# Patient Record
Sex: Male | Born: 1955 | Race: Black or African American | Hispanic: No | Marital: Married | Smoking: Never smoker
Health system: Southern US, Community
[De-identification: ages and names within clinical notes are randomized; demographics above are authoritative.]

## PROBLEM LIST (undated history)

## (undated) DIAGNOSIS — T6701XA Heatstroke and sunstroke, initial encounter: Secondary | ICD-10-CM

## (undated) HISTORY — PX: SMALL INTESTINE SURGERY: SHX150

## (undated) HISTORY — DX: Heatstroke and sunstroke, initial encounter: T67.01XA

---

## 2012-05-19 ENCOUNTER — Ambulatory Visit (INDEPENDENT_AMBULATORY_CARE_PROVIDER_SITE_OTHER): Payer: Managed Care, Other (non HMO) | Admitting: Physician Assistant

## 2012-05-19 VITALS — BP 137/77 | HR 47 | Temp 97.8°F | Resp 16 | Ht 73.75 in | Wt 197.0 lb

## 2012-05-19 DIAGNOSIS — B356 Tinea cruris: Secondary | ICD-10-CM

## 2012-05-19 MED ORDER — CLOTRIMAZOLE 1 % EX CREA: TOPICAL_CREAM | Freq: Two times a day (BID) | CUTANEOUS | Status: DC

## 2012-05-19 NOTE — Patient Instructions (Addendum)
Begin using the clotrimazole cream as directed.  If your symptoms are not improving, or if they are worsening please come back in.   The permethrin that you received from your doctor should have taken care of scabies, if that is what was causing your itching.  You may continue to itch for a few days though.  Zyrtec in the morning and Benadryl at night will helping control the itching.    If symptoms persist, please let us know.   Jock Itch Jock itch is a fungal infection of the skin in the groin area. It is sometimes called "ringworm" even though it is not caused by a worm. A fungus is a type of germ that thrives in dark, damp places.  CAUSES  This infection may spread from:  A fungus infection elsewhere on the body (such as athlete's foot).  Sharing towels or clothing. This infection is more common in:  Hot, humid climates.  People who wear tight-fitting clothing or wet bathing suits for long periods of time.  Athletes.  Overweight people.  People with diabetes. SYMPTOMS  Jock itch causes the following symptoms:  Red, pink or brown rash in the groin. Rash may spread to the thighs, anus, and buttocks.  Itching. DIAGNOSIS  Your caregiver may make the diagnosis by looking at the rash. Sometimes a skin scraping will be sent to test for fungus. Testing can be done either by looking under the microscope or by doing a culture (test to try to grow the fungus). A culture can take up to 2 weeks to come back. TREATMENT  Jock itch may be treated with:  Skin cream or ointment to kill fungus.  Medicine by mouth to kill fungus.  Skin cream or ointment to calm the itching.  Compresses or medicated powders to dry the infected skin. HOME CARE INSTRUCTIONS   Be sure to treat the rash completely. Follow your caregiver's instructions. It can take a couple of weeks to treat. If you do not treat the infection long enough, the rash can come back.  Wear loose-fitting clothing.  Men should  wear cotton boxer shorts.  Women should wear cotton underwear.  Avoid hot baths.  Dry the groin area well after bathing. SEEK MEDICAL CARE IF:   Your rash is worse.  Your rash is spreading.  Your rash returns after treatment is finished.  Your rash is not gone in 4 weeks. Fungal infections are slow to respond to treatment. Some redness may remain for several weeks after the fungus is gone. SEEK IMMEDIATE MEDICAL CARE IF:  The area becomes red, warm, tender, and swollen.  You have a fever. Document Released: 02/03/2002 Document Revised: 05/08/2011 Document Reviewed: 01/03/2008 [facility] Patient Information 2013 [place], [place].

## 2012-05-19 NOTE — Progress Notes (Signed)
  Subjective:    Patient ID: Christopher Davidson, male    DOB: 10/09/1955, 57 y.o.   MRN: [id number]  HPI   Christopher Davidson is a pleasant 57 yr old male here with concern for scabies.  States his wife works at a nursing home where there were recently a number of cases.  Thinks his wife may have had scabies and given to him.  States he called his primary doc and was given a cream to use (thinks permethrin), which he did 2-3 days ago.  Wants to make sure he does not have scabies.  The itching started last week.  The itching was primarily at his shoulder blades and his groin.  Now wondering if this could be jock itch, which he has had before.  Did not notice any rash with the itching.  No itching at hands, feet, finger webs.  Otherwise feels ok.  Has not used anything for symptoms.     Review of Systems  All other systems reviewed and are negative.       Objective:   Physical Exam  Vitals reviewed. Constitutional: He is oriented to person, place, and time. He appears well-developed and well-nourished. No distress.  HENT:  Head: Normocephalic and atraumatic.  Eyes: Conjunctivae are normal. No scleral icterus.  Cardiovascular: Normal rate, regular rhythm and normal heart sounds.   Pulmonary/Chest: Effort normal and breath sounds normal.  Neurological: He is alert and oriented to person, place, and time.  Skin: Skin is warm and dry.  Few excoriations over right scapula; no rashes or lesions; no burrows  Psychiatric: He has a normal mood and affect. His behavior is normal.     Filed Vitals:   05/19/12 1517  BP: 137/77  Pulse: 47  Temp: 97.8 F (36.6 C)  Resp: 16        Assessment & Plan:  Tinea cruris - Plan: clotrimazole (LOTRIMIN) 1 % cream  Christopher Davidson is a pleasant 57 yr old male here with concern for scabies.  I do not see any evidence of rash or scabies, only a few excoriations on his back.  He has already completed one permethrin treatment 2-3 days ago.  Discussed with pt that one treatment  typically resolves cases, but that itching may persist for several days.  Encouraged Zyrtec and/or Benadryl if itching is bothersome.  Will try clotrimazole cream for tinea cruris.  If any symptoms are worsening or not improving, pt will RTC.

## 2013-01-21 ENCOUNTER — Ambulatory Visit: Payer: Self-pay | Admitting: Cardiology

## 2013-02-10 ENCOUNTER — Ambulatory Visit: Payer: Self-pay | Admitting: Cardiology

## 2015-03-05 ENCOUNTER — Ambulatory Visit (INDEPENDENT_AMBULATORY_CARE_PROVIDER_SITE_OTHER): Payer: Managed Care, Other (non HMO) | Admitting: Physician Assistant

## 2015-03-05 VITALS — BP 110/70 | HR 68 | Temp 97.4°F | Resp 16 | Ht 75.0 in | Wt 206.2 lb

## 2015-03-05 DIAGNOSIS — J40 Bronchitis, not specified as acute or chronic: Secondary | ICD-10-CM | POA: Diagnosis not present

## 2015-03-05 MED ORDER — BENZONATATE 100 MG PO CAPS: 100.0000 mg | ORAL_CAPSULE | Freq: Three times a day (TID) | ORAL | Status: DC | PRN

## 2015-03-05 MED ORDER — MOMETASONE FURO-FORMOTEROL FUM 200-5 MCG/ACT IN AERO: 2.0000 | INHALATION_SPRAY | Freq: Two times a day (BID) | RESPIRATORY_TRACT | Status: DC

## 2015-03-05 MED ORDER — AZITHROMYCIN 250 MG PO TABS: ORAL_TABLET | ORAL | Status: DC

## 2015-03-05 NOTE — Progress Notes (Signed)
[facility] [address], [place] [address] [phone]  Date:  03/05/2015   Name:  Christopher Davidson Degregorio   DOB:  03/17/1955   MRN:  [id number]  PCP:  No primary care provider on file.   Chief Complaint  Patient presents with  . chest congestion  . Cough    productive  . Immunizations    flu vaccine     History of Present Illness:  Christopher Davidson is a 60 y.o. male patient who [facility].5 weeks of cough. Patient reports that he is coughing intermittently worse at night.  No sob or trouble breathing.  No fever.  He has had some body aches and chills.  He states that it hurts his lungs with inhalation, and cold water.    Minimal nasal congestion. No hx of asthma. Hx of reefer smoking and crack.  He has some concern that his lungs may be damaged from his past, and would like to know the "age" of his lungs.    There are no active problems to display for this patient.   Past Medical History  Diagnosis Date  . Heat stroke     Past Surgical History  Procedure Laterality Date  . Small intestine surgery      Social History  Substance Use Topics  . Smoking status: Never Smoker   . Smokeless tobacco: None  . Alcohol Use: No    History reviewed. No pertinent family history.  No Known Allergies  Medication list has been reviewed and updated.  Current Outpatient Prescriptions on File Prior to Visit  Medication Sig Dispense Refill  . clotrimazole (LOTRIMIN) 1 % cream Apply topically 2 (two) times daily. (Patient not taking: Reported on 03/05/2015) 30 g 0   No current facility-administered medications on file prior to visit.    ROS ROS otherwise unremarkable unless listed.    Physical Examination: BP 110/70 mmHg  Pulse 68  Temp(Src) 97.4 F (36.3 C) (Oral)  Resp 16  Ht 6\' 3"  (1.905 m)  Wt 206 lb 3.2 oz (93.532 kg)  BMI 25.77 kg/m2  SpO2 98% Ideal Body Weight: Weight in (lb) to have BMI = 25: 199.6 Wt Readings from Last 3 Encounters:   03/05/15 206 lb 3.2 oz (93.532 kg)  05/19/12 197 lb (89.359 kg)    Physical Exam  Constitutional: He is oriented to person, place, and time. He appears well-developed and well-nourished. No distress.  HENT:  Head: Atraumatic.  Right Ear: Tympanic membrane, external ear and ear canal normal.  Left Ear: Tympanic membrane, external ear and ear canal normal.  Nose: Mucosal edema and rhinorrhea present. Right sinus exhibits no maxillary sinus tenderness and no frontal sinus tenderness. Left sinus exhibits no maxillary sinus tenderness and no frontal sinus tenderness.  Mouth/Throat: No uvula swelling. No oropharyngeal exudate, posterior oropharyngeal edema or posterior oropharyngeal erythema.  Eyes: Conjunctivae, EOM and lids are normal. Pupils are equal, round, and reactive to light. Right eye exhibits normal extraocular motion. Left eye exhibits normal extraocular motion.  Neck: Trachea normal and full passive range of motion without pain. No edema and no erythema present.  Cardiovascular: Normal rate.   Pulmonary/Chest: Effort normal. No respiratory distress. He has no decreased breath sounds. He has no wheezes. He has no rhonchi (diffused rhonchi).  Neurological: He is alert and oriented to person, place, and time.  Skin: Skin is warm and dry. He is not diaphoretic.  Psychiatric: He has a normal mood and affect. His  behavior is normal.     Assessment and Plan: [name] is a 60 y.o. male who is here today for cough, and wheezing. --advised to wear scarf/face covering in colder climates. --advised can refer to pulmonologist for function test, or we can do when he is optimal. --poor historian--I will treat at this time.  I am giving an inhaled steroid to help with the sensation.  Bronchitis - Plan: azithromycin (ZITHROMAX) 250 MG tablet, mometasone-formoterol (DULERA) 200-5 MCG/ACT AERO, benzonatate (TESSALON) 100 MG capsule  Trena Platt, PA-C [facility] 03/05/2015 12:52 PM

## 2015-03-05 NOTE — Patient Instructions (Signed)
Please hydrate with 64 oz of water per day.   Acute Bronchitis Bronchitis is inflammation of the airways that extend from the windpipe into the lungs (bronchi). The inflammation often causes mucus to develop. This leads to a cough, which is the most common symptom of bronchitis.  In acute bronchitis, the condition usually develops suddenly and goes away over time, usually in a couple weeks. Smoking, allergies, and asthma can make bronchitis worse. Repeated episodes of bronchitis may cause further lung problems.  CAUSES Acute bronchitis is most often caused by the same virus that causes a cold. The virus can spread from person to person (contagious) through coughing, sneezing, and touching contaminated objects. SIGNS AND SYMPTOMS   Cough.   Fever.   Coughing up mucus.   Body aches.   Chest congestion.   Chills.   Shortness of breath.   Sore throat.  DIAGNOSIS  Acute bronchitis is usually diagnosed through a physical exam. Your health care provider will also ask you questions about your medical history. Tests, such as chest X-rays, are sometimes done to rule out other conditions.  TREATMENT  Acute bronchitis usually goes away in a couple weeks. Oftentimes, no medical treatment is necessary. Medicines are sometimes given for relief of fever or cough. Antibiotic medicines are usually not needed but may be prescribed in certain situations. In some cases, an inhaler may be recommended to help reduce shortness of breath and control the cough. A cool mist vaporizer may also be used to help thin bronchial secretions and make it easier to clear the chest.  HOME CARE INSTRUCTIONS  Get plenty of rest.   Drink enough fluids to keep your urine clear or pale yellow (unless you have a medical condition that requires fluid restriction). Increasing fluids may help thin your respiratory secretions (sputum) and reduce chest congestion, and it will prevent dehydration.   Take medicines only as  directed by your health care provider.  If you were prescribed an antibiotic medicine, finish it all even if you start to feel better.  Avoid smoking and secondhand smoke. Exposure to cigarette smoke or irritating chemicals will make bronchitis worse. If you are a smoker, consider using nicotine gum or skin patches to help control withdrawal symptoms. Quitting smoking will help your lungs heal faster.   Reduce the chances of another bout of acute bronchitis by washing your hands frequently, avoiding people with cold symptoms, and trying not to touch your hands to your mouth, nose, or eyes.   Keep all follow-up visits as directed by your health care provider.  SEEK MEDICAL CARE IF: Your symptoms do not improve after 1 week of treatment.  SEEK IMMEDIATE MEDICAL CARE IF:  You develop an increased fever or chills.   You have chest pain.   You have severe shortness of breath.  You have bloody sputum.   You develop dehydration.  You faint or repeatedly feel like you are going to pass out.  You develop repeated vomiting.  You develop a severe headache. MAKE SURE YOU:   Understand these instructions.  Will watch your condition.  Will get help right away if you are not doing well or get worse.   This information is not intended to replace advice given to you by your health care provider. Make sure you discuss any questions you have with your health care provider.   Document Released: 03/23/2004 Document Revised: 03/06/2014 Document Reviewed: 08/06/2012 Elsevier Interactive Patient Education 2016 [employer] Inc[employer]  

## 2015-04-06 ENCOUNTER — Ambulatory Visit (INDEPENDENT_AMBULATORY_CARE_PROVIDER_SITE_OTHER): Payer: Managed Care, Other (non HMO)

## 2015-04-06 ENCOUNTER — Ambulatory Visit (INDEPENDENT_AMBULATORY_CARE_PROVIDER_SITE_OTHER): Payer: Managed Care, Other (non HMO) | Admitting: Family Medicine

## 2015-04-06 VITALS — BP 128/80 | HR 62 | Temp 98.5°F | Resp 18 | Ht 75.0 in | Wt 206.2 lb

## 2015-04-06 DIAGNOSIS — S161XXA Strain of muscle, fascia and tendon at neck level, initial encounter: Secondary | ICD-10-CM | POA: Diagnosis not present

## 2015-04-06 DIAGNOSIS — M542 Cervicalgia: Secondary | ICD-10-CM | POA: Diagnosis not present

## 2015-04-06 DIAGNOSIS — M546 Pain in thoracic spine: Secondary | ICD-10-CM

## 2015-04-06 DIAGNOSIS — S29012A Strain of muscle and tendon of back wall of thorax, initial encounter: Secondary | ICD-10-CM | POA: Diagnosis not present

## 2015-04-06 DIAGNOSIS — R1310 Dysphagia, unspecified: Secondary | ICD-10-CM

## 2015-04-06 MED ORDER — METHOCARBAMOL 500 MG PO TABS: 500.0000 mg | ORAL_TABLET | Freq: Four times a day (QID) | ORAL | Status: DC

## 2015-04-06 MED ORDER — NAPROXEN 500 MG PO TABS: 500.0000 mg | ORAL_TABLET | Freq: Two times a day (BID) | ORAL | Status: DC

## 2015-04-06 NOTE — Progress Notes (Signed)
Patient ID: Christopher Davidson, male    DOB: 01-20-1956  Age: 60 y.o. MRN: [id number]  Chief Complaint  Patient presents with  . Back Pain    3-4 days ago  . 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 He has been having pain in his neck and back and right shoulder area over the last for 5 days. He had done some work removing a bluish, but he was not bending over pulling it, rather was primarily it out. He doesn't know if he hurt it then. No other major activities that that he knows of that injured it. He is not in major problems in this area in the past. He has taken some aspirin and Tylenol or the pain does still hurts a lot. He is not taking anything today because he is coming in here.  He is generally healthy, on a blood pressure pill.  Current allergies, medications, problem list, past/family and social histories reviewed.  Objective:  BP 128/80 mmHg  Pulse 62  Temp(Src) 98.5 F (36.9 C) (Oral)  Resp 18  Ht  (1.905 m)  Wt 206 lb 3.2 oz (93.532 kg)  BMI 25.77 kg/m2  SpO2 98%  No major acute distress. He has some pain when he flexes his neck. Otherwise neck is satisfactory range of motion. He is not tender in the paraspinous muscles of the shoulders and back, but he does hurt in the right shoulder when he rotates his arm medially. He is tender in the thoracic spine at about the T6-8 level. Flexion of his back causes pain. Extension and side to side tilt are not painful. Rotation of the spine causes pain in that same region. He is also has some pain when he tries to swallow.  Assessment & Plan:   Assessment: 1. Neck pain   2. Midline thoracic back pain   3. Swallowing pain       Plan: Cervical and back pain, etiology unclear, probably strain. We'll get x-rays.  Orders Placed This Encounter  Procedures  . DG Cervical Spine 2 or 3 views    Order Specific Question:  Reason for Exam (SYMPTOM   OR DIAGNOSIS REQUIRED)    Answer:  neck and back pain    Order Specific Question:  Preferred imaging location?    Answer:  External  . DG Thoracic Spine 2 View    Standing Status: Future     Number of Occurrences:      Standing Expiration Date: 04/05/2016    Order Specific Question:  Reason for Exam (SYMPTOM  OR DIAGNOSIS REQUIRED)    Answer:  neck and back pain    Order Specific Question:  Preferred imaging location?    Answer:  External    No orders of the defined types were placed in this encounter.         There are no Patient Instructions on file for this visit.   No Follow-up on file.   HOPPER,DAVID, MD 04/06/2015

## 2015-04-06 NOTE — Patient Instructions (Signed)
Take naproxen 500 mg one twice daily at breakfast and supper for pain and inflammation. Do not take any ibuprofen or Aleve while you're taking this.  Take methocarbamol (Robaxin) 500 mg 1 in the morning, 1 in the afternoon, and 2 at bedtime for muscle relaxant  In addition to the above you can take Tylenol (acetaminophen) 500 mg 2 pills 3 times daily if needed for pain  Return if worse or not improving over the next 5-7 days.

## 2016-02-03 DIAGNOSIS — I10 Essential (primary) hypertension: Secondary | ICD-10-CM | POA: Insufficient documentation

## 2016-08-26 IMAGING — CR DG CERVICAL SPINE 2 OR 3 VIEWS
4 series · 4 of 4 positions shown · non-contrast
Comparison: None.

CLINICAL DATA: Patient with cervical spine pain.

EXAM:
CERVICAL SPINE - 2-3 VIEW

[lateral]
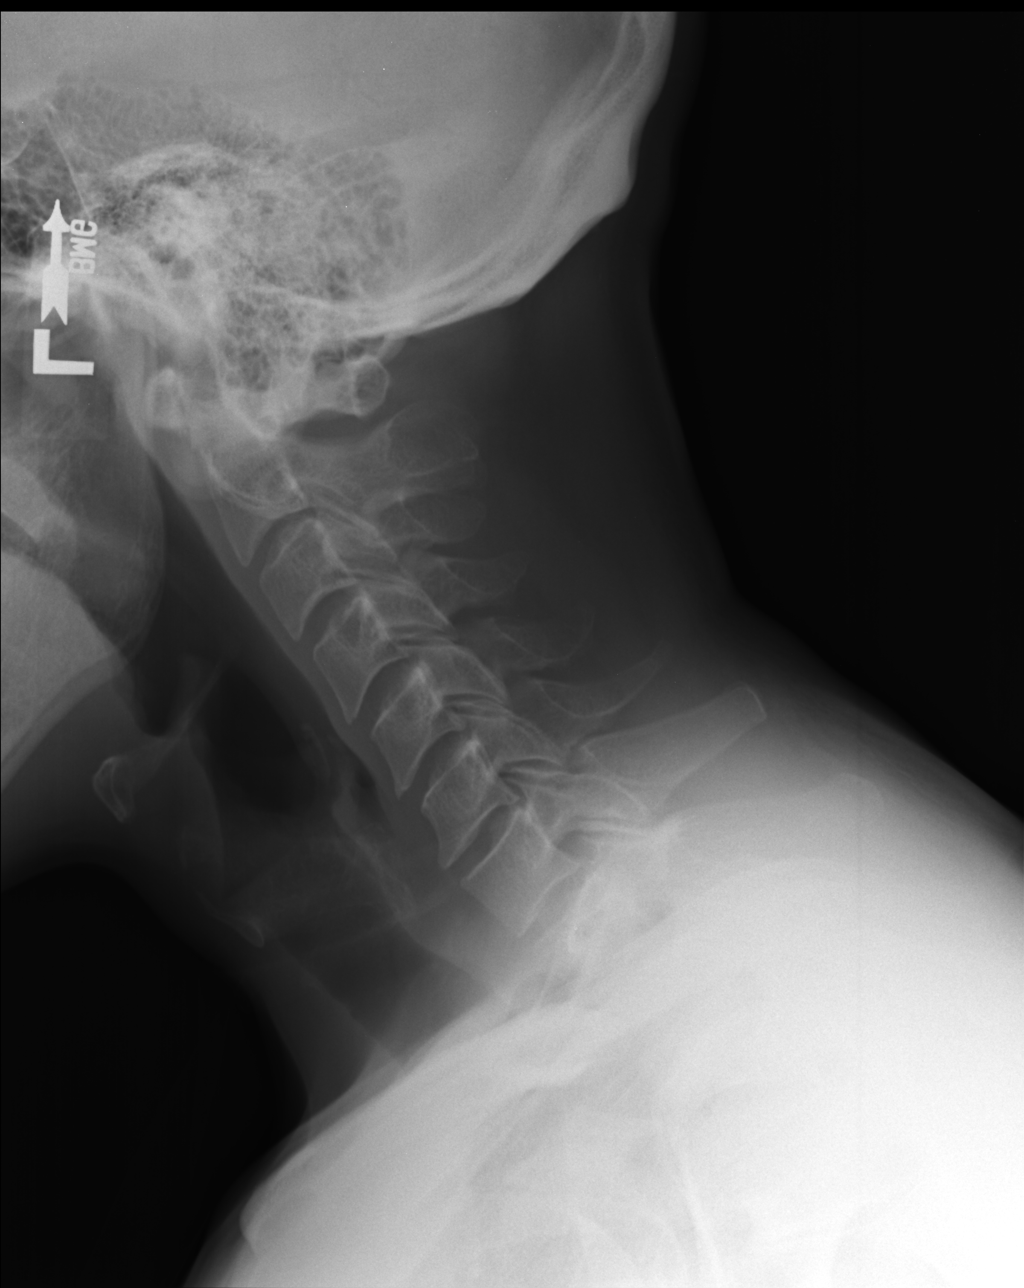

[ap open mouth]
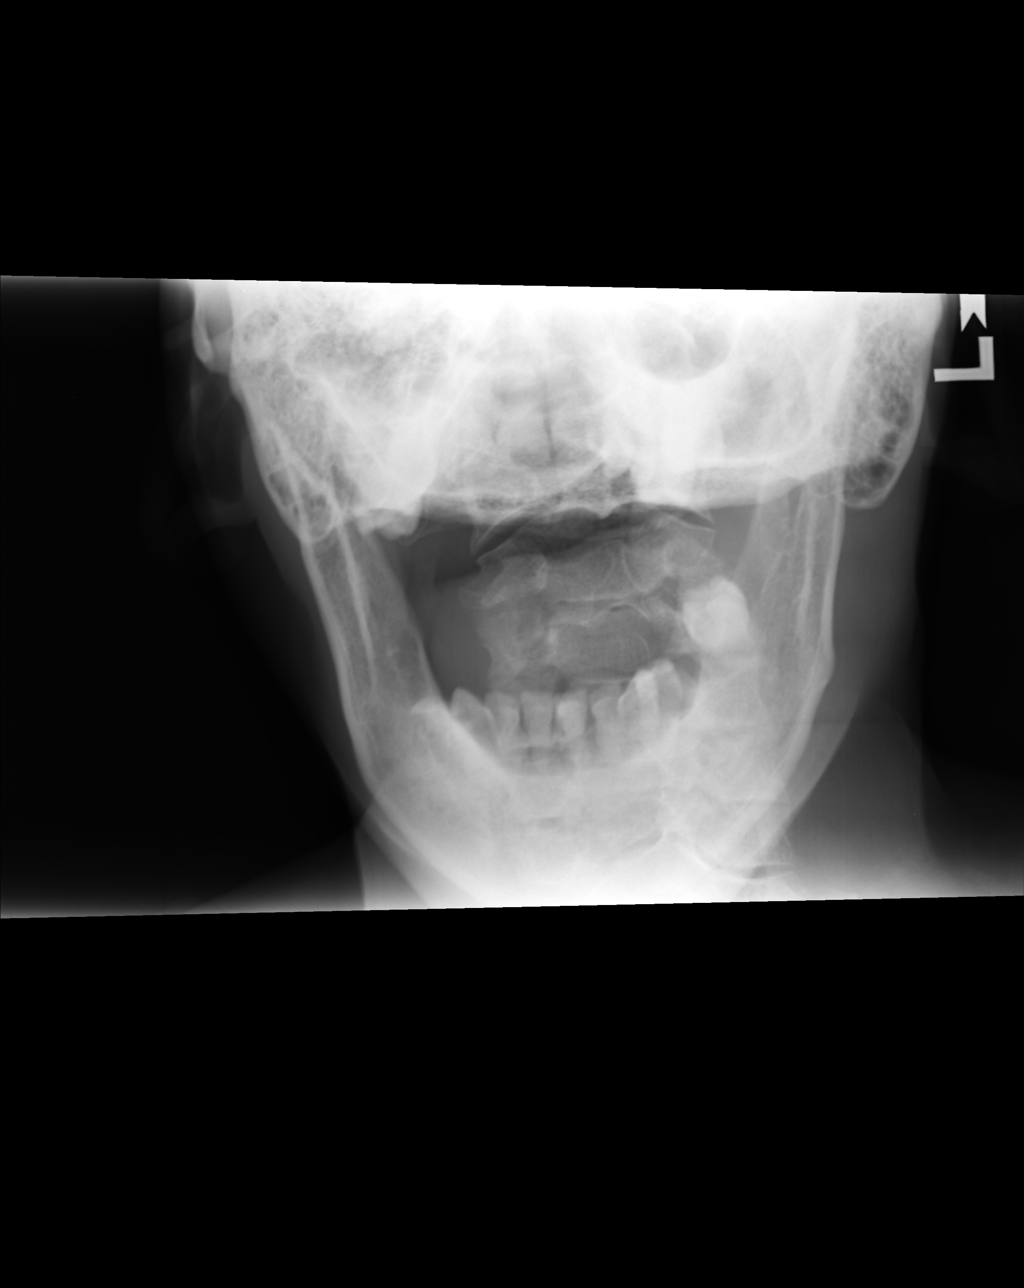

[AP]
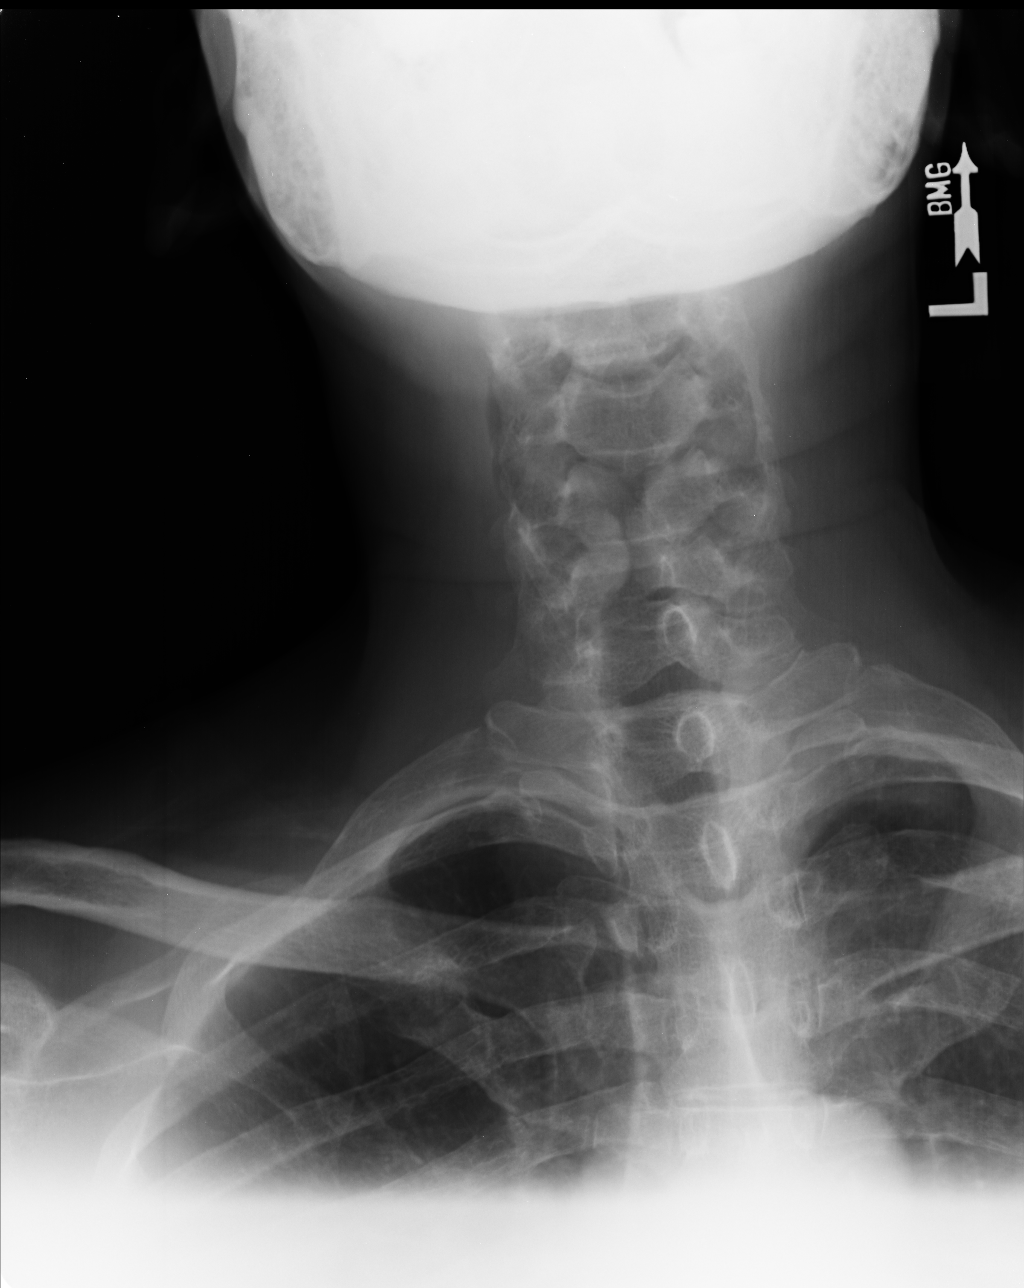

[swimmers]
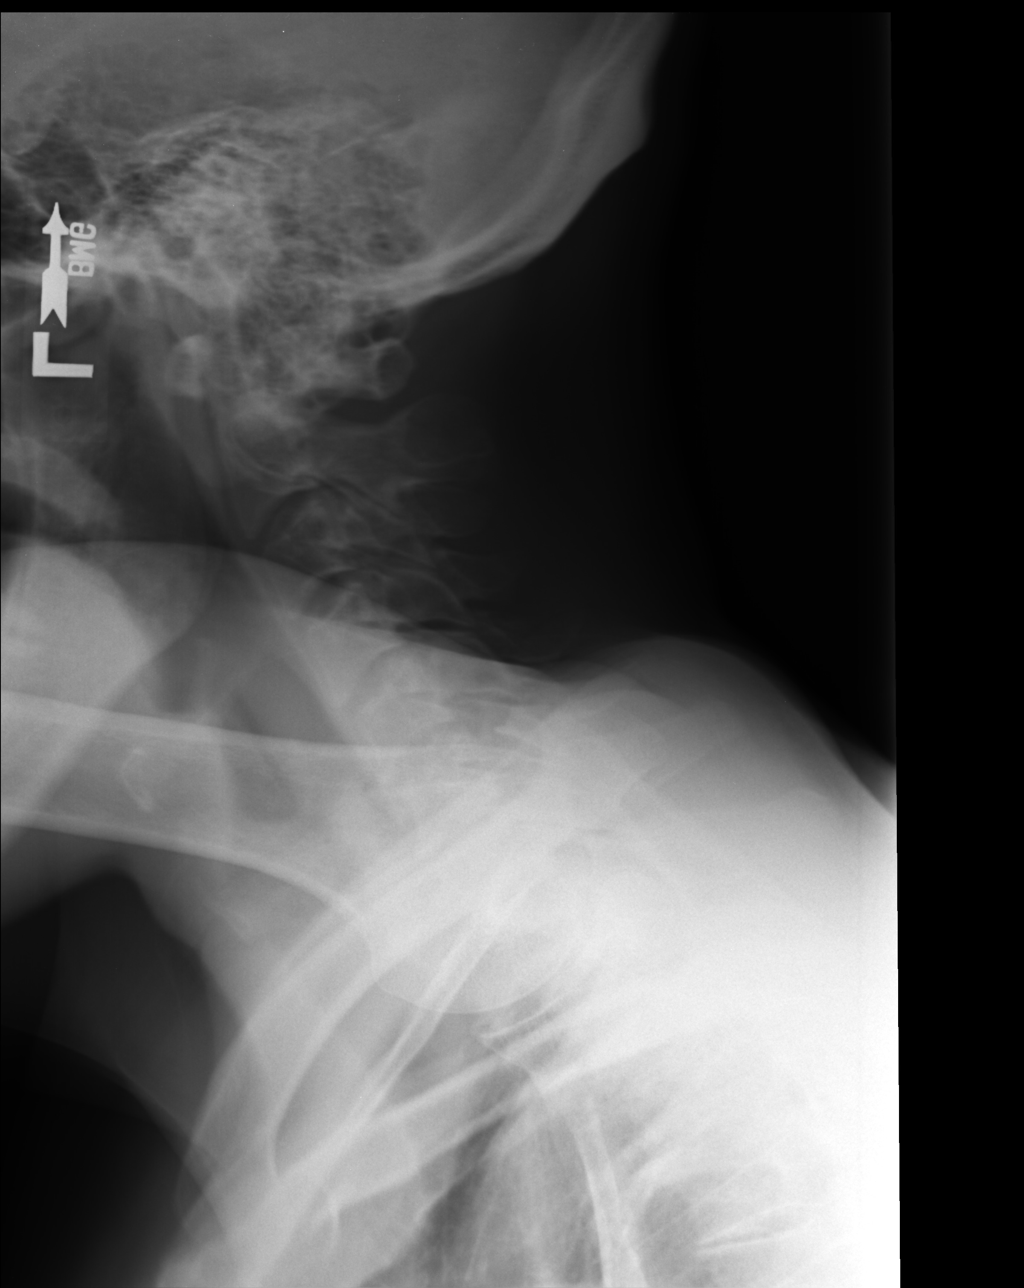

[4 of 4 positions shown; findings below may reference images not displayed]

FINDINGS: Straightening of the normal cervical lordosis. Visualization through
C7 on lateral view. C5-6 and C6-7 degenerative disc disease. Lateral
masses articulate appropriately with the dens. Lung apices are
unremarkable.
IMPRESSION: No acute osseous abnormality. Lower cervical spine degenerative
changes.

## 2016-09-29 ENCOUNTER — Encounter: Payer: Self-pay | Admitting: Emergency Medicine

## 2016-09-29 ENCOUNTER — Ambulatory Visit (INDEPENDENT_AMBULATORY_CARE_PROVIDER_SITE_OTHER): Payer: Managed Care, Other (non HMO) | Admitting: Emergency Medicine

## 2016-09-29 VITALS — BP 111/66 | HR 54 | Temp 97.4°F | Resp 16 | Ht 73.5 in | Wt 186.8 lb

## 2016-09-29 DIAGNOSIS — Z23 Encounter for immunization: Secondary | ICD-10-CM | POA: Insufficient documentation

## 2016-09-29 DIAGNOSIS — L259 Unspecified contact dermatitis, unspecified cause: Secondary | ICD-10-CM | POA: Insufficient documentation

## 2016-09-29 DIAGNOSIS — L0889 Other specified local infections of the skin and subcutaneous tissue: Secondary | ICD-10-CM | POA: Diagnosis not present

## 2016-09-29 MED ORDER — TRIAMCINOLONE ACETONIDE 0.1 % EX CREA: 1.0000 "application " | TOPICAL_CREAM | Freq: Two times a day (BID) | CUTANEOUS | 0 refills | Status: DC

## 2016-09-29 MED ORDER — DOXYCYCLINE HYCLATE 100 MG PO TABS: 100.0000 mg | ORAL_TABLET | Freq: Two times a day (BID) | ORAL | 0 refills | Status: AC

## 2016-09-29 MED ORDER — PREDNISONE 20 MG PO TABS: 40.0000 mg | ORAL_TABLET | Freq: Every day | ORAL | 0 refills | Status: AC

## 2016-09-29 NOTE — Progress Notes (Signed)
Christopher Davidson 61 y.o.   Chief Complaint  Patient presents with  . Rash    per patient "bumps" on both legs and left arm x 1 week  . Leg Swelling    everday - on/off    HISTORY OF PRESENT ILLNESS: This is a 61 y.o. male complaining of bumps to lower legs x 1 week; works and walks in the woods frequently. No other significant symptoms.  HPI   Prior to Admission medications   Medication Sig Start Date End Date Taking? Authorizing Provider  hydrochlorothiazide (MICROZIDE) 12.5 MG capsule Take 12.5 mg by mouth daily.   Yes [provider]  clotrimazole (LOTRIMIN) 1 % cream Apply topically 2 (two) times daily. Patient not taking: Reported on 03/05/2015 05/19/12   Godfrey Pick, PA-C  methocarbamol (ROBAXIN) 500 MG tablet Take 1 tablet (500 mg total) by mouth 4 (four) times daily. Patient not taking: Reported on 09/29/2016 04/06/15   Peyton Najjar, MD  mometasone-formoterol [facility]) 200-5 MCG/ACT AERO Inhale 2 puffs into the lungs 2 (two) times daily. Patient not taking: Reported on 04/06/2015 03/05/15   Trena Platt D, PA  naproxen (NAPROSYN) 500 MG tablet Take 1 tablet (500 mg total) by mouth 2 (two) times daily with a meal. Patient not taking: Reported on 09/29/2016 04/06/15   Peyton Najjar, MD    No Known Allergies  There are no active problems to display for this patient.   Past Medical History:  Diagnosis Date  . Heat stroke     Past Surgical History:  Procedure Laterality Date  . SMALL INTESTINE SURGERY      Social History   Social History  . Marital status: Married    Spouse name: N/A  . Number of children: N/A  . Years of education: N/A   Occupational History  . Not on file.   Social History Main Topics  . Smoking status: Never Smoker  . Smokeless tobacco: Never Used  . Alcohol use No  . Drug use: No  . Sexual activity: Yes   Other Topics Concern  . Not on file   Social History Narrative  . No narrative on file    No family history on  file.   Review of Systems  Constitutional: Negative.  Negative for chills and fever.  HENT: Negative.   Eyes: Negative.   Respiratory: Negative.  Negative for cough and shortness of breath.   Cardiovascular: Negative.  Negative for chest pain and palpitations.  Gastrointestinal: Negative.  Negative for abdominal pain, diarrhea, nausea and vomiting.  Genitourinary: Negative.  Negative for dysuria and hematuria.  Musculoskeletal: Negative.  Negative for back pain, myalgias and neck pain.  Skin: Positive for rash.  Neurological: Negative.  Negative for dizziness, sensory change, focal weakness and headaches.  Endo/Heme/Allergies: Negative.   All other systems reviewed and are negative.  Vitals:   09/29/16 1057  BP: 111/66  Pulse: (!) 54  Resp: 16  Temp: (!) 97.4 F (36.3 C)     Physical Exam  Constitutional: He is oriented to person, place, and time. He appears well-developed and well-nourished.  HENT:  Head: Normocephalic and atraumatic.  Nose: Nose normal.  Mouth/Throat: Oropharynx is clear and moist.  Eyes: Pupils are equal, round, and reactive to light. Conjunctivae and EOM are normal.  Neck: Normal range of motion. Neck supple.  Cardiovascular: Normal rate, regular rhythm and normal heart sounds.   Pulmonary/Chest: Effort normal and breath sounds normal.  Abdominal: Soft. He exhibits no distension. There is  no tenderness.  Musculoskeletal: Normal range of motion.  Neurological: He is alert and oriented to person, place, and time. No sensory deficit. He exhibits normal muscle tone.  Skin: Skin is warm and dry. Capillary refill takes less than 2 seconds. Rash (LE: +maculopapular rash with ocassional blisters both LE L>R; also slightly less in thigh areas; some lesions look infected) noted.  Psychiatric: He has a normal mood and affect. His behavior is normal.  Vitals reviewed.    ASSESSMENT & PLAN: Christopher Davidson was seen today for rash and leg swelling.  Diagnoses and all  orders for this visit:  Contact dermatitis, unspecified contact dermatitis type, unspecified trigger -     predniSONE (DELTASONE) 20 MG tablet; Take 2 tablets (40 mg total) by mouth daily with breakfast. -     triamcinolone cream (KENALOG) 0.1 %; Apply 1 application topically 2 (two) times daily.  Secondary infection of skin  Need for prophylactic vaccination with combined diphtheria-tetanus-pertussis (DTP) vaccine -     Tdap vaccine greater than or equal to 7yo IM  Other orders -     doxycycline (VIBRA-TABS) 100 MG tablet; Take 1 tablet (100 mg total) by mouth 2 (two) times daily.    Patient Instructions       IF you received an x-ray today, you will receive an invoice from [facility]. Please contact [facility] at [phone] with questions or concerns regarding your invoice.   IF you received labwork today, you will receive an invoice from [facility]. Please contact [facility] at [phone] with questions or concerns regarding your invoice.   Our billing staff will not be able to assist you with questions regarding bills from these companies.  You will be contacted with the lab results as soon as they are available. The fastest way to get your results is to activate your My Chart account. Instructions are located on the last page of this paperwork. If you have not heard from us regarding the results in 2 weeks, please contact this office.     Contact Dermatitis Dermatitis is redness, soreness, and swelling (inflammation) of the skin. Contact dermatitis is a reaction to certain substances that touch the skin. You either touched something that irritated your skin, or you have allergies to something you touched. Follow these instructions at home: Skin Care  Moisturize your skin as needed.  Apply cool compresses to the affected areas.  Try taking a bath with: ? Epsom salts. Follow the instructions on the package. You can get these at a pharmacy or grocery  store. ? Baking soda. Pour a small amount into the bath as told by your doctor. ? Colloidal oatmeal. Follow the instructions on the package. You can get this at a pharmacy or grocery store.  Try applying baking soda paste to your skin. Stir water into baking soda until it looks like paste.  Do not scratch your skin.  Bathe less often.  Bathe in lukewarm water. Avoid using hot water. Medicines  Take or apply over-the-counter and prescription medicines only as told by your doctor.  If you were prescribed an antibiotic medicine, take or apply your antibiotic as told by your doctor. Do not stop taking the antibiotic even if your condition starts to get better. General instructions  Keep all follow-up visits as told by your doctor. This is important.  Avoid the substance that caused your reaction. If you do not know what caused it, keep a journal to try to track what caused it. Write down: ? What you  eat. ? What cosmetic products you use. ? What you drink. ? What you wear in the affected area. This includes jewelry.  If you were given a bandage (dressing), take care of it as told by your doctor. This includes when to change and remove it. Contact a doctor if:  You do not get better with treatment.  Your condition gets worse.  You have signs of infection such as: ? Swelling. ? Tenderness. ? Redness. ? Soreness. ? Warmth.  You have a fever.  You have new symptoms. Get help right away if:  You have a very bad headache.  You have neck pain.  Your neck is stiff.  You throw up (vomit).  You feel very sleepy.  You see red streaks coming from the affected area.  Your bone or joint underneath the affected area becomes painful after the skin has healed.  The affected area turns darker.  You have trouble breathing. This information is not intended to replace advice given to you by your health care provider. Make sure you discuss any questions you have with your health  care provider. Document Released: 12/11/2008 Document Revised: 07/22/2015 Document Reviewed: 07/01/2014 Elsevier Interactive Patient Education  2018 Elsevier Inc.      Edwina BarthMiguel Mehlani Blankenburg, MD [facility]

## 2016-09-29 NOTE — Patient Instructions (Addendum)
     IF you received an x-ray today, you will receive an invoice from [facility]. Please contact [facility] at [phone] with questions or concerns regarding your invoice.   IF you received labwork today, you will receive an invoice from [facility]. Please contact [facility] at [phone] with questions or concerns regarding your invoice.   Our billing staff will not be able to assist you with questions regarding bills from these companies.  You will be contacted with the lab results as soon as they are available. The fastest way to get your results is to activate your My Chart account. Instructions are located on the last page of this paperwork. If you have not heard from us regarding the results in 2 weeks, please contact this office.     Contact Dermatitis Dermatitis is redness, soreness, and swelling (inflammation) of the skin. Contact dermatitis is a reaction to certain substances that touch the skin. You either touched something that irritated your skin, or you have allergies to something you touched. Follow these instructions at home: Skin Care  Moisturize your skin as needed.  Apply cool compresses to the affected areas.  Try taking a bath with: ? Epsom salts. Follow the instructions on the package. You can get these at a pharmacy or grocery store. ? Baking soda. Pour a small amount into the bath as told by your doctor. ? Colloidal oatmeal. Follow the instructions on the package. You can get this at a pharmacy or grocery store.  Try applying baking soda paste to your skin. Stir water into baking soda until it looks like paste.  Do not scratch your skin.  Bathe less often.  Bathe in lukewarm water. Avoid using hot water. Medicines  Take or apply over-the-counter and prescription medicines only as told by your doctor.  If you were prescribed an antibiotic medicine, take or apply your antibiotic as told by your doctor. Do not stop taking the antibiotic  even if your condition starts to get better. General instructions  Keep all follow-up visits as told by your doctor. This is important.  Avoid the substance that caused your reaction. If you do not know what caused it, keep a journal to try to track what caused it. Write down: ? What you eat. ? What cosmetic products you use. ? What you drink. ? What you wear in the affected area. This includes jewelry.  If you were given a bandage (dressing), take care of it as told by your doctor. This includes when to change and remove it. Contact a doctor if:  You do not get better with treatment.  Your condition gets worse.  You have signs of infection such as: ? Swelling. ? Tenderness. ? Redness. ? Soreness. ? Warmth.  You have a fever.  You have new symptoms. Get help right away if:  You have a very bad headache.  You have neck pain.  Your neck is stiff.  You throw up (vomit).  You feel very sleepy.  You see red streaks coming from the affected area.  Your bone or joint underneath the affected area becomes painful after the skin has healed.  The affected area turns darker.  You have trouble breathing. This information is not intended to replace advice given to you by your health care provider. Make sure you discuss any questions you have with your health care provider. Document Released: 12/11/2008 Document Revised: 07/22/2015 Document Reviewed: 07/01/2014 Elsevier Interactive Patient Education  2018 Elsevier Inc.  

## 2016-12-15 DIAGNOSIS — I440 Atrioventricular block, first degree: Secondary | ICD-10-CM | POA: Insufficient documentation

## 2017-09-21 ENCOUNTER — Other Ambulatory Visit: Payer: Self-pay

## 2017-09-21 ENCOUNTER — Ambulatory Visit: Payer: Managed Care, Other (non HMO) | Admitting: Physician Assistant

## 2017-09-21 ENCOUNTER — Encounter: Payer: Self-pay | Admitting: Physician Assistant

## 2017-09-21 VITALS — BP 108/62 | HR 67 | Temp 98.6°F | Resp 18 | Ht 73.5 in | Wt 201.6 lb

## 2017-09-21 DIAGNOSIS — M25472 Effusion, left ankle: Secondary | ICD-10-CM

## 2017-09-21 DIAGNOSIS — W57XXXA Bitten or stung by nonvenomous insect and other nonvenomous arthropods, initial encounter: Secondary | ICD-10-CM

## 2017-09-21 DIAGNOSIS — S31109A Unspecified open wound of abdominal wall, unspecified quadrant without penetration into peritoneal cavity, initial encounter: Secondary | ICD-10-CM | POA: Diagnosis not present

## 2017-09-21 DIAGNOSIS — I8392 Asymptomatic varicose veins of left lower extremity: Secondary | ICD-10-CM | POA: Diagnosis not present

## 2017-09-21 NOTE — Progress Notes (Signed)
Christopher Davidson  MRN: [id number] DOB: 09-30-1955  PCP: Katherine Basset, PA-C  Chief Complaint  Patient presents with  . Tick Removal    tick bite on left inner thigh x7days     Subjective:  Pt presents to clinic for tick bite.  He removed the tick and has it with him.  The tick was removed 7 days ago.  The bite was on his left upper thigh.  The area of the tick was swollen but it has gotten better.  There was some itching this am.  He has had some nausea but that has been random.  He has had no rash, fevers, myalgias or feeling bad.  Patient has had chronic swelling in his left ankle.  The swelling resolves as the leg is elevated throughout sleep.  It swells as the day goes on.  He has had no injury to this ankle.  He has not brought this up with his primary care provider.  The ankle does not hurt.  He has no chest pain or shortness of breath.  History is obtained by patient.  Review of Systems  Constitutional: Negative for chills and fever.  Respiratory: Negative for shortness of breath.   Cardiovascular: Negative for chest pain.  Musculoskeletal: Negative for myalgias.  Skin: Negative for rash.  Neurological: Negative for numbness.    Patient Active Problem List   Diagnosis Date Noted  . First degree AV block 12/15/2016  . Contact dermatitis 09/29/2016  . Secondary infection of skin 09/29/2016  . Need for prophylactic vaccination with combined diphtheria-tetanus-pertussis (DTP) vaccine 09/29/2016  . Essential hypertension 02/03/2016    Current Outpatient Medications on File Prior to Visit  Medication Sig Dispense Refill  . tamsulosin (FLOMAX) 0.4 MG CAPS capsule Take 0.4 mg by mouth.     No current facility-administered medications on file prior to visit.     No Known Allergies  Past Medical History:  Diagnosis Date  . Heat stroke    Social History   Social History Narrative  . Not on file   Social History   Tobacco Use  . Smoking status: Never Smoker  .  Smokeless tobacco: Never Used  Substance Use Topics  . Alcohol use: No  . Drug use: No   family history is not on file.     Objective:  BP 108/62   Pulse 67   Temp 98.6 F (37 C) (Oral)   Resp 18   Ht 6' 1.5" (1.867 m)   Wt 201 lb 9.6 oz (91.4 kg)   SpO2 100%   BMI 26.24 kg/m  Body mass index is 26.24 kg/m.  Wt Readings from Last 3 Encounters:  09/21/17 201 lb 9.6 oz (91.4 kg)  09/29/16 186 lb 12.8 oz (84.7 kg)  04/06/15 206 lb 3.2 oz (93.5 kg)    Physical Exam  Constitutional: He is oriented to person, place, and time. He appears well-developed and well-nourished.  HENT:  Head: Normocephalic and atraumatic.  Right Ear: External ear normal.  Left Ear: External ear normal.  Eyes: Conjunctivae are normal.  Neck: Normal range of motion.  Cardiovascular: Normal rate, regular rhythm and normal heart sounds.  No murmur heard. Pulmonary/Chest: Effort normal and breath sounds normal. He has no wheezes.  Musculoskeletal:       Left foot: There is swelling (Significant swelling associated with ankle that does not go into leg.).  Good pedal pulses.  Varicose veins visible on the left none seen on the right.  No change in  skin coloration of the left or right leg.  Lymphadenopathy:       Right: No inguinal adenopathy present.       Left: No inguinal adenopathy present.  Neurological: He is alert and oriented to person, place, and time.  Skin: Skin is warm and dry. No rash noted.     Psychiatric: He has a normal mood and affect. His behavior is normal. Judgment and thought content normal.  Vitals reviewed.  Tick identified as [place] tick.  Assessment and Plan :  Wound of left groin, initial encounter - Healing wound discussed with patient that the induration may take several weeks to resolve but there is no sign of infection currently  Tick bite, initial encounter -patient had a [place] tick bite.  Did discuss with patient increased risk of alpha gal allergy he is to  be mindful if he has any symptoms when ingesting beef products.  He has any further concerns involving rash fevers myalgias he will return back to the time that this is an unlikely symptom of [place] tick bites.  Left ankle swelling -suspect this is swelling due to peripheral vascular disease from the varicose veins of the left lower extremity.  He was encouraged to buy medical grade medium compression support knee highs and wear them during work.  Elevating his feet when he can.  And following up with his primary care provider for further work-up which will likely include vein and vascular referral for ultrasound.  Asymptomatic varicose veins of left lower extremity-Visible on exam and needs to be evaluated with PCP.  Patient verbalized to me that they understand the following: diagnosis, what is being done for them, what to expect and what should be done at home.  Their questions have been answered.  See after visit summary for patient specific instructions.  Benny LennertSarah Oluwatosin Higginson PA-C  [facility] 09/21/2017 12:27 PM  Please note: Portions of this report may have been transcribed using dragon voice recognition software. Every effort was made to ensure accuracy; however, inadvertent computerized transcription errors may be present.

## 2017-09-21 NOTE — Patient Instructions (Addendum)
25-35 compression socks -- on [employer] is the brand that I like  Follow up with your PCP for Varicose veins - might be helpful to get an [place] on these vessels to see if anything can be done to decrease your swelling in your ankle.  For the tick bite - watch and wait - [place] Tick  IF you received an x-ray today, you will receive an invoice from [facility]. Please contact [facility] at [phone] with questions or concerns regarding your invoice.   IF you received labwork today, you will receive an invoice from [facility]. Please contact [facility] at [phone] with questions or concerns regarding your invoice.   Our billing staff will not be able to assist you with questions regarding bills from these companies.  You will be contacted with the lab results as soon as they are available. The fastest way to get your results is to activate your My Chart account. Instructions are located on the last page of this paperwork. If you have not heard from [place] regarding the results in 2 weeks, please contact this office.     Varicose Veins Varicose veins are veins that have become enlarged and twisted. They are usually seen in the legs but can occur in other parts of the body as well. What are the causes? This condition is the result of valves in the veins not working properly. Valves in the veins help to return blood from the leg to the heart. If these valves are damaged, blood flows backward and backs up into the veins in the leg near the skin. This causes the veins to become larger. What increases the risk? People who are on their feet a lot, who are pregnant, or who are overweight are more likely to develop varicose veins. What are the signs or symptoms?  Bulging, twisted-appearing, bluish veins, most commonly found on the legs.  Leg pain or a feeling of heaviness. These symptoms may be worse at the end of the day.  Leg swelling.  Changes in skin color. How is this  diagnosed? A health care provider can usually diagnose varicose veins by examining your legs. Your health care provider may also recommend an ultrasound of your leg veins. How is this treated? Most varicose veins can be treated at home.However, other treatments are available for people who have persistent symptoms or want to improve the cosmetic appearance of the varicose veins. These treatment options include:  Sclerotherapy. A solution is injected into the vein to close it off.  Laser treatment. A laser is used to heat the vein to close it off.  Radiofrequency vein ablation. An electrical current produced by radio waves is used to close off the vein.  Phlebectomy. The vein is surgically removed through small incisions made over the varicose vein.  Vein ligation and stripping. The vein is surgically removed through incisions made over the varicose vein after the vein has been tied (ligated).  Follow these instructions at home:  Do not stand or sit in one position for long periods of time. Do not sit with your legs crossed. Rest with your legs raised during the day.  Wear compression stockings as directed by your health care provider. These stockings help to prevent blood clots and reduce swelling in your legs.  Do not wear other tight, encircling garments around your legs, pelvis, or waist.  Walk as much as possible to increase blood flow.  Raise the foot of your bed at night with 2-inch blocks.  If you get a cut in the skin over the vein and the vein bleeds, lie down with your leg raised and press on it with a clean cloth until the bleeding stops. Then place a bandage (dressing) on the cut. See your health care provider if it continues to bleed. Contact a health care provider if:  The skin around your ankle starts to break down.  You have pain, redness, tenderness, or hard swelling in your leg over a vein.  You are uncomfortable because of leg pain. This information is not  intended to replace advice given to you by your health care provider. Make sure you discuss any questions you have with your health care provider. Document Released: 11/23/2004 Document Revised: 07/22/2015 Document Reviewed: 08/17/2015 Elsevier Interactive Patient Education  2017 [employer]

## 2018-01-05 ENCOUNTER — Encounter (HOSPITAL_COMMUNITY): Payer: Self-pay

## 2018-01-05 ENCOUNTER — Ambulatory Visit (HOSPITAL_COMMUNITY)
Admission: EM | Admit: 2018-01-05 | Discharge: 2018-01-05 | Disposition: A | Discharge status: Home / Self Care | Payer: Managed Care, Other (non HMO) | Attending: Internal Medicine | Admitting: Internal Medicine

## 2018-01-05 DIAGNOSIS — L0291 Cutaneous abscess, unspecified: Secondary | ICD-10-CM

## 2018-01-05 DIAGNOSIS — L723 Sebaceous cyst: Secondary | ICD-10-CM

## 2018-01-05 DIAGNOSIS — I8392 Asymptomatic varicose veins of left lower extremity: Secondary | ICD-10-CM

## 2018-01-05 DIAGNOSIS — R609 Edema, unspecified: Secondary | ICD-10-CM

## 2018-01-05 MED ORDER — DOXYCYCLINE HYCLATE 75 MG PO TABS: ORAL_TABLET | ORAL | 0 refills | Status: DC

## 2018-01-05 NOTE — ED Triage Notes (Signed)
Pt presents with sore throat, coughing up mucus, body aches and abscesses on back.

## 2018-01-05 NOTE — Discharge Instructions (Addendum)
1- you do not have symptoms of the Flu today which I dont suspect it. But if your body aches get worse and you get a fever, you need to be seen. Please follow up with your family Dr next week if those dont resolve  2- Use heat on the painful lump on your back( boil) 15 minutes at a time  3-4 times a day for 5 days.   3- If this area gets larger and more painful, it may be ready to be drained, so come back. Today it is too hard and is not ready to be drained.  4- take the antibiotic for the boil infection til gone.  5- wear a support knee high house 14 mmHG pressure, you may advance to 18 if you tolerate the pressure.

## 2018-01-05 NOTE — ED Provider Notes (Signed)
[facility]    CSN: [id number] Arrival date & time: 01/05/18  1113     History   Chief Complaint Chief Complaint  Patient presents with  . Abscess  . URI    HPI Christopher Davidson is a 62 y.o. male.   Who come in for 2 problesm  1- Onset of body aches 2-3 days with URI or GI symptoms.  Denies fever, chills or sweats. He has continued working this week.   2- Has had a lump on his back for 3-4 years, and right next to it developed a new one and is painful 3-4 days ago. Has not been draining. Denies history of staph or MRSA infection.       Past Medical History:  Diagnosis Date  . Heat stroke     Patient Active Problem List   Diagnosis Date Noted  . First degree AV block 12/15/2016  . Contact dermatitis 09/29/2016  . Secondary infection of skin 09/29/2016  . Need for prophylactic vaccination with combined diphtheria-tetanus-pertussis (DTP) vaccine 09/29/2016  . Essential hypertension 02/03/2016    Past Surgical History:  Procedure Laterality Date  . SMALL INTESTINE SURGERY         Home Medications    Prior to Admission medications   Medication Sig Start Date End Date Taking? Authorizing Provider  Doxycycline Hyclate 75 MG TABS One bid x 10 days 01/05/18   Rodriguez-Southworth, Nettie Elm, PA-C  tamsulosin (FLOMAX) 0.4 MG CAPS capsule Take 0.4 mg by mouth.    [provider]    Family History History reviewed. No pertinent family history.  Social History Social History   Tobacco Use  . Smoking status: Never Smoker  . Smokeless tobacco: Never Used  Substance Use Topics  . Alcohol use: No  . Drug use: No     Allergies   Patient has no known allergies.   Review of Systems Review of Systems  Constitutional: Negative for activity change, appetite change, chills, diaphoresis and fever.  HENT: Negative.   Eyes: Negative for discharge.  Respiratory: Negative for cough, chest tightness and shortness of breath.   Cardiovascular: Positive  for leg swelling. Negative for chest pain and palpitations.       Gets lower leg swelling at the end of the day. 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 Support sock has helped. Denies calf pain.   Gastrointestinal: Negative for abdominal pain, diarrhea, nausea and vomiting.  Musculoskeletal: Positive for myalgias.  Skin: Negative for rash.       Lumps on back as noted in HPI  Allergic/Immunologic: Negative for immunocompromised state.  Neurological: Negative for dizziness and weakness.     Physical Exam Triage Vital Signs ED Triage Vitals  Enc Vitals Group     BP 01/05/18 1216 126/79     Pulse Rate 01/05/18 1216 69     Resp --      Temp 01/05/18 1216 97.9 F (36.6 C)     Temp Source 01/05/18 1216 Oral     SpO2 01/05/18 1216 100 %     Weight --      Height --      Head Circumference --      Peak Flow --      Pain Score 01/05/18 1217 7     Pain Loc --      Pain Edu? --      Excl. in GC? --    No data found.  Updated Vital Signs BP 126/79 (BP Location: Right Arm)  Pulse 69   Temp 97.9 F (36.6 C) (Oral)   SpO2 100%   Visual Acuity Right Eye Distance:   Left Eye Distance:   Bilateral Distance:    Right Eye Near:   Left Eye Near:    Bilateral Near:     Physical Exam  Constitutional: He is oriented to person, place, and time. He appears well-developed and well-nourished. No distress.  HENT:  Head: Normocephalic.  Right Ear: External ear normal.  Left Ear: External ear normal.  Nose: Nose normal.  Mouth/Throat: Oropharynx is clear and moist. No oropharyngeal exudate.  Both TMs gray and shiny  Eyes: Conjunctivae are normal. Right eye exhibits no discharge.  Neck: Neck supple. No tracheal deviation present.  Cardiovascular: Normal rate, regular rhythm and intact distal pulses.  Murmur heard. 1/6 murmur  Pulmonary/Chest: Effort normal and breath sounds normal.  Musculoskeletal: Normal range of motion. He exhibits edema. He exhibits no tenderness or deformity.  + 1/4  pitting edema of L ankle and lower shin, none of the L. Has large non tender varicose vein on L calf. Does not have calf asymmetry or cords or tenderness.   Lymphadenopathy:    He has no cervical adenopathy.  Neurological: He is alert and oriented to person, place, and time. Abnormal coordination: none.  Skin: Skin is warm and dry. Capillary refill takes less than 2 seconds. No rash noted. He is not diaphoretic.  Has a 2x2 sebaceous cys on L mid back with a black head in the middle, but not red, tender of fluctuant.  Has 3x4 cm erythematous, firm, and tender mass on R mid thorax slightly higher than the L. This also has a black head. No fluctuation noted. One area has orange peel skin look.   Psychiatric: He has a normal mood and affect. His behavior is normal. Judgment and thought content normal.  Nursing note and vitals reviewed.    UC Treatments / Results  Labs (all labs ordered are listed, but only abnormal results are displayed) Labs Reviewed - No data to display  EKG None  Radiology No results found.  Procedures none  Medications Ordered in UC Medications - No data to display  Initial Impression / Assessment and Plan / UC Course  I have reviewed the triage vital signs and the nursing notes. Sebaceous cyst x 2, R one with possible early abscess which is not ready for I&D. I placed him on Doxy for 10 days as noted. Needs to use heat on area and if he gets worse to come back in. He was educated about all his diagnosis, all his questions were answered    Final Clinical Impressions(s) / UC Diagnoses   Final diagnoses:  Abscess  Sebaceous cyst  Edema, unspecified type  Asymptomatic varicose veins of left lower extremity     Discharge Instructions     1- you do not have symptoms of the Flu today which I dont suspect it. But if your body aches get worse and you get a fever, you need to be seen. Please follow up with your family Dr next week if those dont resolve  2-  Use heat on the painful lump on your back( boil) 15 minutes at a time  3-4 times a day for 5 days.   3- If this area gets larger and more painful, it may be ready to be drained, so come back. Today it is too hard and is not ready to be drained.  4- take the antibiotic for the  boil infection til gone.  5- wear a support knee high house 14 mmHG pressure, you may advance to 18 if you tolerate the pressure.      ED Prescriptions    Medication Sig Dispense Auth. Provider   Doxycycline Hyclate 75 MG TABS One bid x 10 days 20 tablet Rodriguez-Southworth, Nettie Elm, PA-C     Controlled Substance Prescriptions [place] Controlled Substance Registry consulted?    Garey Ham, PA-C 01/05/18 1332

## 2018-03-04 ENCOUNTER — Other Ambulatory Visit (HOSPITAL_COMMUNITY): Payer: Self-pay | Admitting: Internal Medicine

## 2018-03-05 NOTE — Telephone Encounter (Signed)
Urgent care patient

## 2018-05-16 ENCOUNTER — Other Ambulatory Visit: Payer: Self-pay

## 2018-05-16 ENCOUNTER — Ambulatory Visit (HOSPITAL_COMMUNITY)
Admission: EM | Admit: 2018-05-16 | Discharge: 2018-05-16 | Disposition: A | Discharge status: Home / Self Care | Payer: Managed Care, Other (non HMO) | Attending: Internal Medicine | Admitting: Internal Medicine

## 2018-05-16 ENCOUNTER — Encounter (HOSPITAL_COMMUNITY): Payer: Self-pay | Admitting: Emergency Medicine

## 2018-05-16 DIAGNOSIS — B9789 Other viral agents as the cause of diseases classified elsewhere: Secondary | ICD-10-CM

## 2018-05-16 DIAGNOSIS — J069 Acute upper respiratory infection, unspecified: Secondary | ICD-10-CM

## 2018-05-16 MED ORDER — NYSTATIN 100000 UNIT/ML MT SUSP: 500000.0000 [IU] | Freq: Four times a day (QID) | OROMUCOSAL | 0 refills | Status: AC

## 2018-05-16 MED ORDER — ACETAMINOPHEN 325 MG PO TABS: 650.0000 mg | ORAL_TABLET | Freq: Four times a day (QID) | ORAL | Status: AC | PRN

## 2018-05-16 NOTE — ED Notes (Signed)
Patient verbalizes understanding of discharge instructions. Opportunity for questioning and answers were provided. Patient discharged from [facility] by MD. 

## 2018-05-16 NOTE — ED Provider Notes (Signed)
[facility]    CSN: [id number] Arrival date & time: 05/16/18  1717     History   Chief Complaint Chief Complaint  Patient presents with  . Cough    HPI Christopher Davidson is a 63 y.o. male with no past medical history comes to urgent care with complaints of sore throat, cough of 1 day duration.  Patient denies any fever, generalized body aches.  He has runny nose.  No nausea or vomiting.  Patient symptoms are persistent.  Appetite is fair.  No difficulty swallowing.Marland Kitchen   HPI  Past Medical History:  Diagnosis Date  . Heat stroke     Patient Active Problem List   Diagnosis Date Noted  . First degree AV block 12/15/2016  . Contact dermatitis 09/29/2016  . Secondary infection of skin 09/29/2016  . Need for prophylactic vaccination with combined diphtheria-tetanus-pertussis (DTP) vaccine 09/29/2016  . Essential hypertension 02/03/2016    Past Surgical History:  Procedure Laterality Date  . SMALL INTESTINE SURGERY         Home Medications    Prior to Admission medications   Medication Sig Start Date End Date Taking? Authorizing Provider  Doxycycline Hyclate 75 MG TABS One bid x 10 days Patient not taking: Reported on 05/16/2018 01/05/18   Rodriguez-Southworth, Nettie Elm, PA-C  tamsulosin (FLOMAX) 0.4 MG CAPS capsule Take 0.4 mg by mouth.    [provider]    Family History No family history on file.  Social History Social History   Tobacco Use  . Smoking status: Never Smoker  . Smokeless tobacco: Never Used  Substance Use Topics  . Alcohol use: No  . Drug use: No     Allergies   Patient has no known allergies.   Review of Systems Review of Systems  Constitutional: Negative for activity change, appetite change, chills, diaphoresis, fatigue, fever and unexpected weight change.  HENT: Positive for rhinorrhea and sore throat. Negative for ear discharge, ear pain, facial swelling, postnasal drip, sinus pressure, sinus pain and tinnitus.   Eyes:  Negative for pain, discharge and itching.  Respiratory: Negative for cough, chest tightness and wheezing.   Cardiovascular: Negative for chest pain and palpitations.  Gastrointestinal: Negative for abdominal distention, abdominal pain, diarrhea and nausea.  Genitourinary: Negative for dysuria, frequency and urgency.  Musculoskeletal: Negative for arthralgias, gait problem and myalgias.  Skin: Negative for rash.  Neurological: Negative for dizziness, numbness and headaches.     Physical Exam Triage Vital Signs ED Triage Vitals [05/16/18 1727]  Enc Vitals Group     BP 135/89     Pulse Rate 60     Resp 16     Temp 98.1 F (36.7 C)     Temp src      SpO2 98 %     Weight      Height      Head Circumference      Peak Flow      Pain Score 6     Pain Loc      Pain Edu?      Excl. in GC?    No data found.  Updated Vital Signs BP 135/89   Pulse 60   Temp 98.1 F (36.7 C)   Resp 16   SpO2 98%   Visual Acuity Right Eye Distance:   Left Eye Distance:   Bilateral Distance:    Right Eye Near:   Left Eye Near:    Bilateral Near:     Physical Exam Constitutional:  General: He is not in acute distress.    Appearance: Normal appearance. He is not ill-appearing.  HENT:     Right Ear: Tympanic membrane normal.     Left Ear: Tympanic membrane normal.     Nose: Nose normal.     Mouth/Throat:     Mouth: Mucous membranes are moist.     Comments: Cobblestoning in the oropharynx with a few white patches.  No signs of oral thrush. Eyes:     Conjunctiva/sclera: Conjunctivae normal.  Neck:     Musculoskeletal: No neck rigidity.  Cardiovascular:     Rate and Rhythm: Normal rate and regular rhythm.     Pulses: Normal pulses.     Heart sounds: Normal heart sounds.  Pulmonary:     Effort: Pulmonary effort is normal.     Breath sounds: Normal breath sounds.  Abdominal:     General: Bowel sounds are normal. There is no distension.  Musculoskeletal: Normal range of motion.   Lymphadenopathy:     Cervical: No cervical adenopathy.  Skin:    General: Skin is warm.     Capillary Refill: Capillary refill takes less than 2 seconds.  Neurological:     General: No focal deficit present.     Mental Status: He is alert and oriented to person, place, and time.      UC Treatments / Results  Labs (all labs ordered are listed, but only abnormal results are displayed) Labs Reviewed - No data to display  EKG None  Radiology No results found.  Procedures Procedures (including critical care time)  Medications Ordered in UC Medications - No data to display  Initial Impression / Assessment and Plan / UC Course  I have reviewed the triage vital signs and the nursing notes.  Pertinent labs & imaging results that were available during my care of the patient were reviewed by me and considered in my medical decision making (see chart for details).     1.  Viral illness with cough: Nystatin swish and swallow Tylenol as needed for pain/fever Encourage oral fluid intake Patient had questions about Novel coronavirus: Patient is low risk.  He has not traveled out of the [facility] and denies any contact with know Novel corona virus patient.  Final Clinical Impressions(s) / UC Diagnoses   Final diagnoses:  None   Discharge Instructions   None    ED Prescriptions    None     Controlled Substance Prescriptions [place] Controlled Substance Registry consulted? No   Merrilee Jansky, MD 05/16/18 1757

## 2018-05-16 NOTE — ED Triage Notes (Signed)
Pt c/o cough, body aches, back pain, shoulder pain, for several days.

## 2019-09-03 ENCOUNTER — Other Ambulatory Visit: Payer: Self-pay

## 2019-09-03 ENCOUNTER — Emergency Department (HOSPITAL_COMMUNITY)
Admission: EM | Admit: 2019-09-03 | Discharge: 2019-09-04 | Disposition: A | Discharge status: Home / Self Care | Payer: Managed Care, Other (non HMO) | Attending: Emergency Medicine | Admitting: Emergency Medicine

## 2019-09-03 DIAGNOSIS — Z5321 Procedure and treatment not carried out due to patient leaving prior to being seen by health care provider: Secondary | ICD-10-CM | POA: Diagnosis not present

## 2019-09-03 DIAGNOSIS — M25562 Pain in left knee: Secondary | ICD-10-CM | POA: Insufficient documentation

## 2019-09-04 ENCOUNTER — Encounter (HOSPITAL_COMMUNITY): Payer: Self-pay

## 2019-09-04 ENCOUNTER — Other Ambulatory Visit: Payer: Self-pay

## 2019-09-04 NOTE — ED Notes (Signed)
Pt ambulated from lobby to triage room 1

## 2019-09-04 NOTE — ED Notes (Signed)
Pt called for rooming x1. No answer!! 

## 2019-09-04 NOTE — ED Triage Notes (Addendum)
Pt reports L posterior knee and calf pain that started earlier today, but stopped when he arrived at the hospital. No swelling or redness noted. Denies injury. Ambulatory.

## 2020-06-22 ENCOUNTER — Ambulatory Visit
Admission: EM | Admit: 2020-06-22 | Discharge: 2020-06-22 | Disposition: A | Discharge status: Home / Self Care | Payer: Managed Care, Other (non HMO)

## 2020-06-22 ENCOUNTER — Other Ambulatory Visit: Payer: Self-pay

## 2022-05-15 ENCOUNTER — Ambulatory Visit
Admission: RE | Admit: 2022-05-15 | Discharge: 2022-05-15 | Disposition: A | Discharge status: Home / Self Care | Payer: Medicare Other | Source: Ambulatory Visit | Attending: Family Medicine | Admitting: Family Medicine

## 2022-05-15 ENCOUNTER — Other Ambulatory Visit: Payer: Self-pay | Admitting: Family Medicine

## 2022-05-15 DIAGNOSIS — M25561 Pain in right knee: Secondary | ICD-10-CM

## 2022-05-29 ENCOUNTER — Ambulatory Visit (INDEPENDENT_AMBULATORY_CARE_PROVIDER_SITE_OTHER): Payer: Medicare Other | Admitting: Podiatry

## 2022-05-29 DIAGNOSIS — B351 Tinea unguium: Secondary | ICD-10-CM

## 2022-05-29 DIAGNOSIS — M79674 Pain in right toe(s): Secondary | ICD-10-CM | POA: Diagnosis not present

## 2022-05-29 DIAGNOSIS — M79675 Pain in left toe(s): Secondary | ICD-10-CM | POA: Diagnosis not present

## 2022-05-29 DIAGNOSIS — L600 Ingrowing nail: Secondary | ICD-10-CM | POA: Diagnosis not present

## 2022-05-29 NOTE — Progress Notes (Signed)
Subjective:   Patient ID: Christopher Davidson, male   DOB: 67 y.o.   MRN: [id number]   HPI Patient presents with severely thickened dystrophic big toenails of both feet that are hard for him to take care of them cut and has been an issue for him for a fairly long.  Time.  Patient states has been going on for years and does not smoke likes to be active   Review of Systems  All other systems reviewed and are negative.       Objective:  Physical Exam Vitals and nursing note reviewed.  Constitutional:      Appearance: He is well-developed.  Pulmonary:     Effort: Pulmonary effort is normal.  Musculoskeletal:        General: Normal range of motion.  Skin:    General: Skin is warm.  Neurological:     Mental Status: He is alert.     Neurovascular status intact muscle strength adequate range of motion adequate with patient found to have severe thickness of the hallux nailbeds bilateral that are moderately tender and possible for him to cut and a bit present for a number of years.  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

## 2023-01-01 ENCOUNTER — Ambulatory Visit: Payer: Medicare Other | Admitting: Podiatry

## 2024-02-29 ENCOUNTER — Telehealth: Payer: Self-pay | Admitting: *Deleted

## 2024-02-29 DIAGNOSIS — M2042 Other hammer toe(s) (acquired), left foot: Secondary | ICD-10-CM

## 2024-02-29 NOTE — Telephone Encounter (Signed)
 I called the patient and asked him to arrive for his appointment on Monday at 8:20 am because we need xrays of his foot.  I asked him to enter via the entrance at the back of the building, then imaging will be on the right.

## 2024-03-03 ENCOUNTER — Ambulatory Visit (INDEPENDENT_AMBULATORY_CARE_PROVIDER_SITE_OTHER): Admitting: Podiatry

## 2024-03-03 ENCOUNTER — Encounter: Payer: Self-pay | Admitting: Podiatry

## 2024-03-03 DIAGNOSIS — B351 Tinea unguium: Secondary | ICD-10-CM | POA: Diagnosis not present

## 2024-03-03 MED ORDER — KETOCONAZOLE 2 % EX CREA: 1.0000 | TOPICAL_CREAM | Freq: Every day | CUTANEOUS | 2 refills | Status: AC

## 2024-03-03 NOTE — Progress Notes (Addendum)
"  °  Subjective:  Patient ID: Christopher Davidson, male    DOB: 05-20-1955,   MRN: [id number]  Chief Complaint  Patient presents with   Toe Pain    I think I have an ingrown toenail and fungus.  The toenail hurts when I put pressure on top of  it.      69 y.o. male presents for concern of toenail fungus and possible ingrown toenails of his bilateral great toes and left second toe.  Relates it has been like this for years.  He denies any current treatments.  He is hoping to have something done today.. Denies any other pedal complaints. Denies n/v/f/c.   Past Medical History:  Diagnosis Date   Heat stroke     Objective:  Physical Exam: Vascular: DP/PT pulses 2/4 bilateral. CFT <3 seconds. Normal hair growth on digits. No edema.  Skin. No lacerations or abrasions bilateral feet.  Hallux nails and left second digit nails dystrophic with subungual debris discoloration. Musculoskeletal: MMT 5/5 bilateral lower extremities in DF, PF, Inversion and Eversion. Deceased ROM in DF of ankle joint.  Neurological: Sensation intact to light touch.   Assessment:   1. Onychomycosis      Plan:  Patient was evaluated and treated and all questions answered. -Examined patient -Discussed treatment options for painful dystrophic nails  -Clinical picture and Fungal culture was obtained by removing a portion of the hard nail itself from each of the involved toenails using a sterile nail nipper and sent to [facility] lab. Patient tolerated the biopsy procedure well without discomfort or need for anesthesia.  -Sent to pharmacy ketoconazole  -Discussed fungal nail treatment options including oral, topical, and laser treatments.  -Patient to return in 4 weeks for follow up evaluation and discussion of fungal culture results or sooner if symptoms worsen.   Asberry Failing, DPM    "

## 2024-03-03 NOTE — Addendum Note (Signed)
 Addended by: Yovana Scogin J on: 03/03/2024 11:50 AM   Modules accepted: Orders

## 2024-03-03 NOTE — Addendum Note (Signed)
 Addended by: Samayra Hebel R on: 03/03/2024 09:52 AM   Modules accepted: Orders

## 2024-03-07 ENCOUNTER — Other Ambulatory Visit: Payer: Self-pay | Admitting: Podiatry

## 2024-03-31 ENCOUNTER — Ambulatory Visit: Admitting: Podiatry

## 2024-04-03 ENCOUNTER — Ambulatory Visit: Admitting: Podiatry

## 2024-04-07 ENCOUNTER — Ambulatory Visit: Admitting: Podiatry
